# Patient Record
Sex: Male | Born: 2000 | Race: Black or African American | Hispanic: No | Marital: Single | Smoking: Current every day smoker
Health system: Southern US, Community
[De-identification: ages and names within clinical notes are randomized; demographics above are authoritative.]

## PROBLEM LIST (undated history)

## (undated) HISTORY — PX: EYE SURGERY: SHX253

---

## 2020-08-17 ENCOUNTER — Ambulatory Visit
Admission: RE | Admit: 2020-08-17 | Discharge: 2020-08-17 | Disposition: A | Discharge status: Home / Self Care | Payer: No Typology Code available for payment source | Source: Ambulatory Visit | Attending: *Deleted | Admitting: *Deleted

## 2020-08-17 ENCOUNTER — Other Ambulatory Visit: Payer: Self-pay | Admitting: *Deleted

## 2020-08-17 DIAGNOSIS — R042 Hemoptysis: Secondary | ICD-10-CM

## 2020-08-24 ENCOUNTER — Emergency Department (HOSPITAL_COMMUNITY)
Admission: EM | Admit: 2020-08-24 | Discharge: 2020-08-24 | Disposition: A | Discharge status: Home / Self Care | Payer: Managed Care, Other (non HMO) | Attending: Emergency Medicine | Admitting: Emergency Medicine

## 2020-08-24 ENCOUNTER — Encounter (HOSPITAL_COMMUNITY): Payer: Self-pay

## 2020-08-24 DIAGNOSIS — R45851 Suicidal ideations: Secondary | ICD-10-CM | POA: Diagnosis not present

## 2020-08-24 DIAGNOSIS — F6381 Intermittent explosive disorder: Secondary | ICD-10-CM | POA: Diagnosis present

## 2020-08-24 DIAGNOSIS — Z046 Encounter for general psychiatric examination, requested by authority: Secondary | ICD-10-CM

## 2020-08-24 DIAGNOSIS — F331 Major depressive disorder, recurrent, moderate: Secondary | ICD-10-CM | POA: Diagnosis not present

## 2020-08-24 DIAGNOSIS — Z20822 Contact with and (suspected) exposure to covid-19: Secondary | ICD-10-CM | POA: Diagnosis not present

## 2020-08-24 DIAGNOSIS — Y900 Blood alcohol level of less than 20 mg/100 ml: Secondary | ICD-10-CM | POA: Diagnosis not present

## 2020-08-24 DIAGNOSIS — R4689 Other symptoms and signs involving appearance and behavior: Secondary | ICD-10-CM | POA: Diagnosis present

## 2020-08-24 LAB — CBC WITH DIFFERENTIAL/PLATELET
Abs Immature Granulocytes: 0.01 10*3/uL (ref 0.00–0.07)
Basophils Absolute: 0 10*3/uL (ref 0.0–0.1)
Basophils Relative: 0 %
Eosinophils Absolute: 0.1 10*3/uL (ref 0.0–0.5)
Eosinophils Relative: 1 %
HCT: 43.5 % (ref 39.0–52.0)
Hemoglobin: 14.7 g/dL (ref 13.0–17.0)
Immature Granulocytes: 0 %
Lymphocytes Relative: 17 %
Lymphs Abs: 1.5 10*3/uL (ref 0.7–4.0)
MCH: 27.7 pg (ref 26.0–34.0)
MCHC: 33.8 g/dL (ref 30.0–36.0)
MCV: 81.9 fL (ref 80.0–100.0)
Monocytes Absolute: 0.7 10*3/uL (ref 0.1–1.0)
Monocytes Relative: 7 %
Neutro Abs: 6.5 10*3/uL (ref 1.7–7.7)
Neutrophils Relative %: 75 %
Platelets: 309 10*3/uL (ref 150–400)
RBC: 5.31 MIL/uL (ref 4.22–5.81)
RDW: 13.2 % (ref 11.5–15.5)
WBC: 8.7 10*3/uL (ref 4.0–10.5)
nRBC: 0 % (ref 0.0–0.2)

## 2020-08-24 LAB — COMPREHENSIVE METABOLIC PANEL
ALT: 24 U/L (ref 0–44)
AST: 37 U/L (ref 15–41)
Albumin: 5.2 g/dL — ABNORMAL HIGH (ref 3.5–5.0)
Alkaline Phosphatase: 53 U/L (ref 38–126)
Anion gap: 10 (ref 5–15)
BUN: 17 mg/dL (ref 6–20)
CO2: 25 mmol/L (ref 22–32)
Calcium: 10.1 mg/dL (ref 8.9–10.3)
Chloride: 104 mmol/L (ref 98–111)
Creatinine, Ser: 1.02 mg/dL (ref 0.61–1.24)
GFR, Estimated: >60 mL/min (ref >60)
Glucose, Bld: 98 mg/dL (ref 70–99)
Potassium: 3.9 mmol/L (ref 3.5–5.1)
Sodium: 139 mmol/L (ref 135–145)
Total Bilirubin: 0.7 mg/dL (ref 0.3–1.2)
Total Protein: 8.5 g/dL — ABNORMAL HIGH (ref 6.5–8.1)

## 2020-08-24 LAB — ETHANOL: Alcohol, Ethyl (B): <10 mg/dL (ref <10)

## 2020-08-24 LAB — SARS CORONAVIRUS 2 (TAT 6-24 HRS): SARS Coronavirus 2: NEGATIVE

## 2020-08-24 MED ORDER — ZOLPIDEM TARTRATE 5 MG PO TABS: 5.0000 mg | ORAL_TABLET | Freq: Every evening | ORAL | Status: DC | PRN

## 2020-08-24 MED ORDER — ALUM & MAG HYDROXIDE-SIMETH 200-200-20 MG/5ML PO SUSP: 30.0000 mL | Freq: Four times a day (QID) | ORAL | Status: DC | PRN

## 2020-08-24 MED ORDER — QUETIAPINE FUMARATE 100 MG PO TABS: 100.0000 mg | ORAL_TABLET | Freq: Every evening | ORAL | 1 refills | Status: AC | PRN

## 2020-08-24 MED ORDER — ACETAMINOPHEN 325 MG PO TABS: 650.0000 mg | ORAL_TABLET | ORAL | Status: DC | PRN

## 2020-08-24 MED ORDER — ONDANSETRON HCL 4 MG PO TABS: 4.0000 mg | ORAL_TABLET | Freq: Three times a day (TID) | ORAL | Status: DC | PRN

## 2020-08-24 MED ORDER — NICOTINE 21 MG/24HR TD PT24
21.0000 mg | MEDICATED_PATCH | Freq: Every day | TRANSDERMAL | Status: DC
  Administered 2020-08-24: 21 mg via TRANSDERMAL
  Filled 2020-08-24: qty 1

## 2020-08-24 MED ORDER — ARIPIPRAZOLE 5 MG PO TABS
5.0000 mg | ORAL_TABLET | Freq: Every day | ORAL | 1 refills | Status: AC
Start: 2020-08-24 — End: ?

## 2020-08-24 MED ORDER — SERTRALINE HCL 50 MG PO TABS: 50.0000 mg | ORAL_TABLET | Freq: Every day | ORAL | 1 refills | Status: AC
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
## 2020-08-24 NOTE — ED Notes (Signed)
Pt escorted to discharge window. Verbalized understanding discharge instructions, prescriptions, and follow-up. In no acute distress.
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
## 2020-08-24 NOTE — ED Provider Notes (Signed)
Patient cleared by behavioral health for discharge home.  Have provided 1 month supply of his medications.   Vanetta Mulders, MD 08/24/20 [phone]
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
## 2020-08-24 NOTE — Discharge Instructions (Addendum)
Helping Someone Who Is Suicidal Suicide is the act of ending (taking) one's own life. Someone who is thinking about suicide needs immediate help. Even if you do not know what to say or do to help, you can start by letting the person know that you care. Listen to him or her. Then talk about how to get help. Help is available through suicide hotlines, therapy, and other treatments. What are signs that someone is suicidal? Common signs include:  Signs of depression, such as: ? Tearfulness or sadness. ? Irritability or rage. ? Problems with eating or sleeping. ? Feeling guilty or worthless. ? Loss of interest in things that a person used to enjoy. ? Feelings of hopelessness or helplessness. ? Recurrent thoughts of death or suicide.  Changes in social behaviors and relationships, including: ? Isolating oneself. ? Withdrawing from friends and family. ? Giving away possessions. ? Saying goodbye. ? Acting aggressively. ? Sleeping more or less than usual. ? Having trouble managing school or work. ? Talking about feeling hopeless or being a burden. ? Engaging in risky behaviors, such as drinking more alcohol or using more drugs. What are the risk factors for suicide? Risk factors for suicide include:  Having a friend or family member who has died by suicide.  A history of attempted suicide.  Depression or other mental health problems.  Being exposed to graphic stories of suicide in the media.  Alcohol or drug abuse, especially when combined with a mental illness.  A serious physical problem, such as chronic pain.  A stressful life event, now or in the past, such as: ? Divorce or social rejection. ? Childhood abuse or neglect. ? Sudden life changes, such as financial crisis or going to jail. What should I do if someone is suicidal? If you think someone may be suicidal:  Ask him or her directly: "Are you thinking about suicide or hurting yourself?" Asking that question does not  make someone more likely to make a suicide attempt.  Avoid giving advice or arguing with the person about the value of his or her life. If a person confides in you that he or she is considering suicide:  Take the person seriously. Never ignore comments about suicide.  Listen to the person's thoughts and concerns with compassion.  Let the person know that you will stay with him or her.  Offer to help the person get to a doctor or mental health professional.  Remove all weapons and medicines from the person's living area.  Do not promise to keep his or her thoughts of suicide a secret.  Call a suicide crisis helpline, such as the National Suicide Prevention Lifeline at 475 154 8404 or text TALK to 434-120-3981.   Get help right away if: You believe that a person is in immediate danger of hurting himself or herself, or may have thoughts of taking his or her own life. You can:  Call a crisis center or a local suicide prevention center. These are often located at hospitals, clinics, [employer], social service providers, or health departments.  Call a suicide crisis helpline, such as the National Suicide Prevention Lifeline at 239-061-7683, or text TALK to 786-552-5385. This is open 24 hours a day.  Take the person to the nearest emergency department.  Call your local emergency services (911 in the [place]  [facility] [place] [address] health and human services helpline (211 in the [place] Summary  Suicide is the act of ending (taking) one's own life.  Suicide can be  prevented by knowing the signs and taking action.  If you know someone who is showing risk factors for suicide, ask if he or she is thinking about hurting himself or herself. Take all concerns about suicide seriously, and get support from experts in mental illness or suicide.  If you believe that a person is in immediate danger of hurting himself or herself, or may have thoughts of taking his or her own life, get help  right away. This information is not intended to replace advice given to you by your health care provider. Make sure you discuss any questions you have with your health care provider. Document Revised: 04/08/2020 Document Reviewed: 04/08/2020 Elsevier Patient Education  2021 Elsevier Inc.   Suicidal Feelings: How to Help Yourself Suicide is when you end your own life. There are many things you can do to help yourself feel better when struggling with these feelings. Many services and people are available to support you and others who struggle with similar feelings.  If you ever feel like you may hurt yourself or others, or have thoughts about taking your own life, get help right away. To get help:  Call your local emergency services (911 in the [place]  [facility] [place] health and human services helpline (211 in the [place]  Go to your nearest emergency department.  Call a suicide hotline to speak with a trained counselor. The following suicide hotlines are available in the [place]: ? 1-800-273-TALK 937-337-2932(1-331-159-2421). ? 1-800-SUICIDE (848)155-9601(1-303-825-7942). ? 609-685-36561-225-709-6914. This is a hotline for Spanish speakers. ? (651) 518-00721-205-874-7770. This is a hotline for TTY users. ? 1-866-4-U-TREVOR 309-362-1424(1-715-189-1128). This is a hotline for lesbian, gay, bisexual, transgender, or questioning youth. ? For a list of hotlines in [place], visit ItCheaper.dkwww.suicide.org/hotlines/international/[place]-suicide-hotlines.html  Contact a crisis center or a local suicide prevention center. To find a crisis center or suicide prevention center: ? Call your local hospital, clinic, community service organization, mental health center, social service provider, or health department. Ask for help with connecting to a crisis center. ? For a list of crisis centers in the [place], visit: suicidepreventionlifeline.org ? For a list of crisis centers in [place], visit: suicideprevention.ca How to help yourself feel better  Promise  yourself that you will not do anything extreme when you have suicidal feelings. Remember the times you have felt hopeful. Many people have gotten through suicidal thoughts and feelings, and you can too. If you have had these feelings before, remind yourself that you can get through them again.  Let family, friends, teachers, or counselors know how you are feeling. Try not to separate yourself from those who care about you and want to help you. Talk with someone every day, even if you do not feel sociable. Face-to-face conversation is best to help them understand your feelings.  Contact a mental health care provider and work with this person regularly.  Make a safety plan that you can follow during a crisis. Include phone numbers of suicide prevention hotlines, mental health professionals, and trusted friends and family members you can call during an emergency. Save these numbers on your phone.  If you are thinking of taking a lot of medicine, give your medicine to someone who can give it to you as prescribed. If you are on antidepressants and are concerned you will overdose, tell your health care provider so that he or she can give you safer medicines.  Try to stick to your routines and follow a schedule every day. Make self-care a priority.  Make a  list of realistic goals, and cross them off when you achieve them. Accomplishments can give you a sense of worth.  Wait until you are feeling better before doing things that you find difficult or unpleasant.  Do things that you have always enjoyed to take your mind off your feelings. Try reading a book, or listening to or playing music. Spending time outside, in nature, may help you feel better.   Follow these instructions at home:  Visit your primary health care provider every year for a checkup.  Work with a mental health care provider as needed.  Eat a well-balanced diet, and eat regular meals.  Get plenty of rest.  Exercise if you are able.  Just 30 minutes of exercise each day can help you feel better.  Take over-the-counter and prescription medicines only as told by your health care provider. Ask your mental health care provider about the possible side effects of any medicines you are taking.  Do not use alcohol or drugs, and remove these substances from your home.  Remove weapons, poisons, knives, and other deadly items from your home.   General recommendations  Keep your living space well lit.  When you are feeling well, write yourself a letter with tips and support that you can read when you are not feeling well.  Remember that life's difficulties can be sorted out with help. Conditions can be treated, and you can learn behaviors and ways of thinking that will help you. Where to find more information  National Suicide Prevention Lifeline: www.suicidepreventionlifeline.org  Hopeline: www.hopeline.com  [employer] for Suicide Prevention: https://www.ayers.com/  [employer] (for lesbian, gay, bisexual, transgender, or questioning youth): www.thetrevorproject.[employer] of Mental Health: http://www.wall.info/ Contact a health care provider if:  You feel as though you are a burden to others.  You feel agitated, angry, vengeful, or have extreme mood swings.  You have withdrawn from family and friends. Get help right away if:  You are talking about suicide or wishing to die.  You start making plans for how to commit suicide.  You feel that you have no reason to live.  You start making plans for putting your affairs in order, saying goodbye, or giving your possessions away.  You feel guilt, shame, or unbearable pain, and it seems like there is no way out.  You are frequently using drugs or alcohol.  You are engaging in risky behaviors that could lead to death. If you have any of these symptoms, get help right away. Call emergency services, go to your  nearest emergency department or crisis center, or call a suicide crisis helpline. Summary  Suicide is when you take your own life.  Promise yourself that you will not do anything extreme when you have suicidal feelings.  Let family, friends, teachers, or counselors know how you are feeling.  Get help right away if you start making plans for how to commit suicide. This information is not intended to replace advice given to you by your health care provider. Make sure you discuss any questions you have with your health care provider. Document Revised: 04/08/2020 Document Reviewed: 04/08/2020 Elsevier Patient Education  2021 [employer]. For your behavioral health needs, you are advised to follow up with the [facility] [place].  Contact them at your earliest opportunity to schedule an intake appointment:       [facility]. [address]  [place], [place] [address]      [phone]

## 2020-08-24 NOTE — ED Notes (Signed)
Pt remains very pleasant and cooperative.  Asking how much longer.  Pt informed it could be hours.

## 2020-08-24 NOTE — BH Assessment (Signed)
[facility] Assessment Progress Note  Per Shuvon Rankin, NP, this pt does not require psychiatric hospitalization at this time.  Pt presents under IVC initiated by Dr Elray Buba  which has been rescinded by EDP Vanetta Mulders, MD.  Pt is psychiatrically cleared.  Discharge instructions advise pt to follow up with the [facility].  Dr. Deretha Emory and pt's nurse, Lenis Dickinson, have been notified.  Doylene Canning, MA Triage Specialist [phone]

## 2020-08-24 NOTE — ED Notes (Signed)
2 pt belonging bags returned back to pt. Pt making a phone call now for transportation.

## 2020-08-24 NOTE — ED Notes (Signed)
Pt has 2 belonging bags on top shelf of cabinet in triage under ice machine

## 2020-08-25 ENCOUNTER — Telehealth (HOSPITAL_COMMUNITY): Payer: Self-pay | Admitting: Psychiatry

## 2022-09-13 IMAGING — CR DG CHEST 2V
2 series · 2 of 2 positions shown · non-contrast
Comparison: None.

CLINICAL DATA: Hemoptysis.

EXAM:
CHEST - 2 VIEW

[w chest pa]
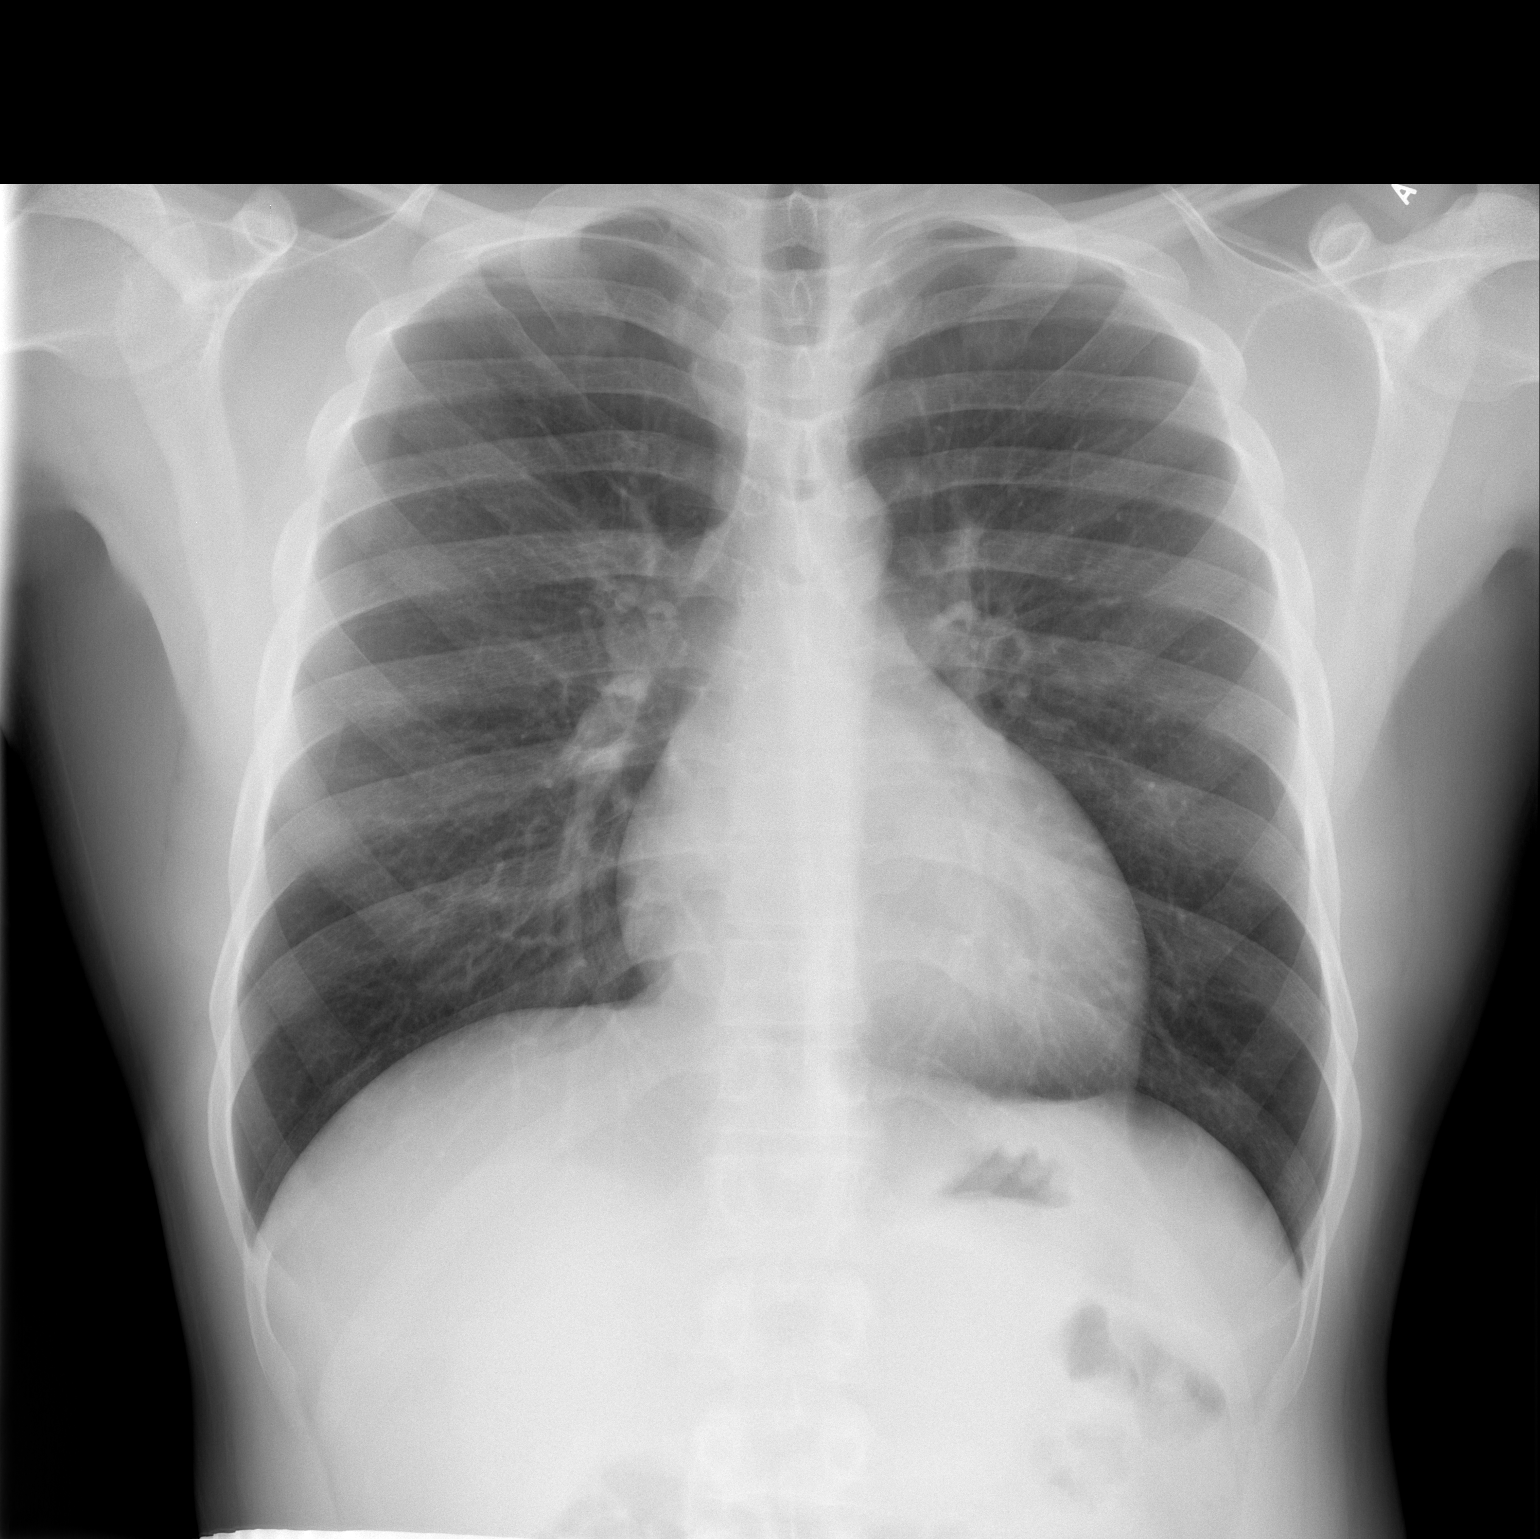

[w chest lat]
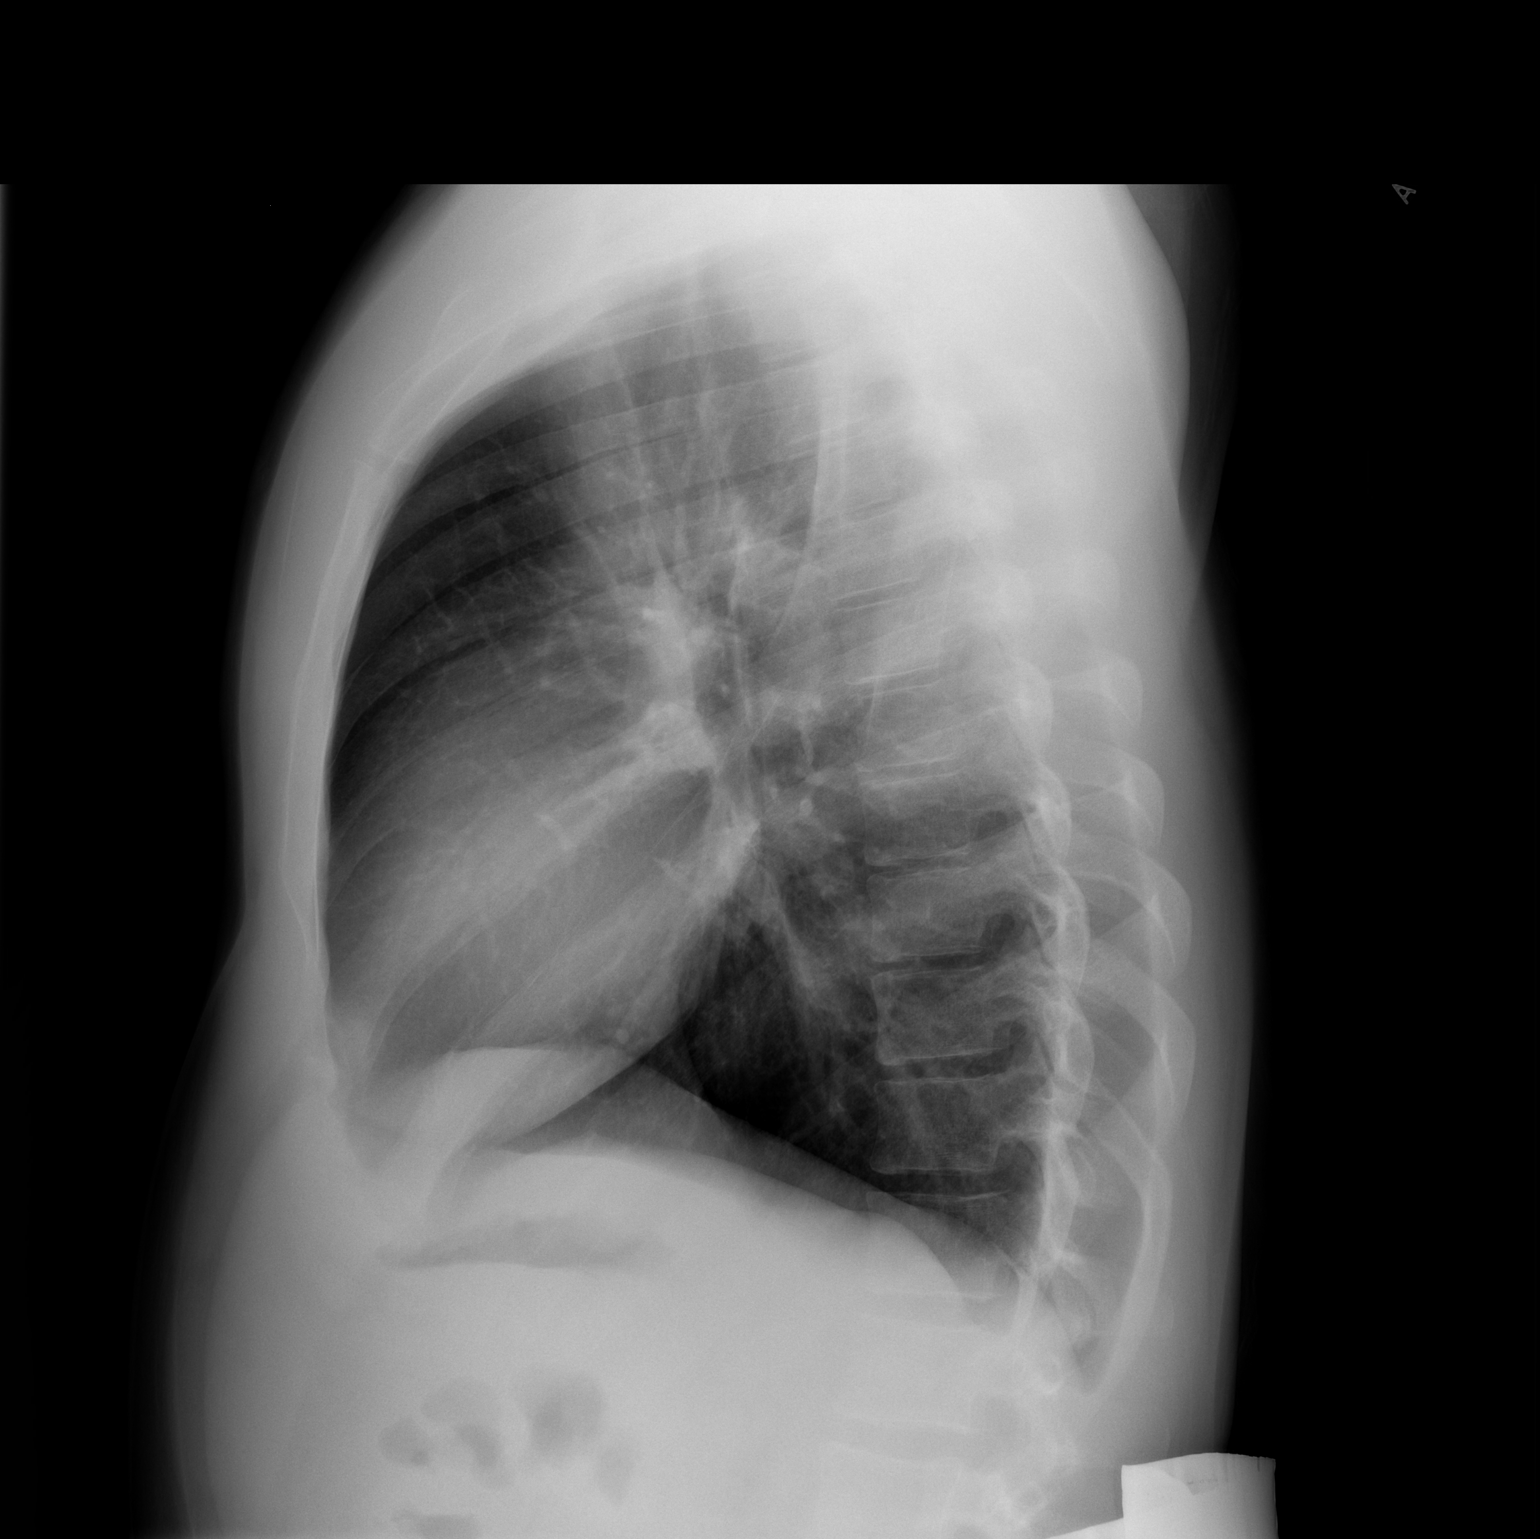

[2 of 2 positions shown; findings below may reference images not displayed]

FINDINGS: The heart size and mediastinal contours are within normal limits.
Both lungs are clear. The visualized skeletal structures are
unremarkable.
IMPRESSION: No active cardiopulmonary disease.

## 2023-08-29 DIAGNOSIS — Z01818 Encounter for other preprocedural examination: Secondary | ICD-10-CM | POA: Diagnosis not present

## 2024-01-16 DIAGNOSIS — Z419 Encounter for procedure for purposes other than remedying health state, unspecified: Secondary | ICD-10-CM | POA: Diagnosis not present

## 2024-02-15 DIAGNOSIS — Z419 Encounter for procedure for purposes other than remedying health state, unspecified: Secondary | ICD-10-CM | POA: Diagnosis not present

## 2024-03-17 DIAGNOSIS — Z419 Encounter for procedure for purposes other than remedying health state, unspecified: Secondary | ICD-10-CM | POA: Diagnosis not present

## 2024-04-17 DIAGNOSIS — Z419 Encounter for procedure for purposes other than remedying health state, unspecified: Secondary | ICD-10-CM | POA: Diagnosis not present

## 2024-06-17 DIAGNOSIS — Z419 Encounter for procedure for purposes other than remedying health state, unspecified: Secondary | ICD-10-CM | POA: Diagnosis not present

## 2024-06-22 ENCOUNTER — Ambulatory Visit (HOSPITAL_COMMUNITY)
Admission: EM | Admit: 2024-06-22 | Discharge: 2024-06-22 | Disposition: A | Discharge status: Home / Self Care | Payer: Self-pay

## 2024-06-22 NOTE — Progress Notes (Signed)
 Per triage, pt left AMA.

## 2024-06-23 ENCOUNTER — Ambulatory Visit (INDEPENDENT_AMBULATORY_CARE_PROVIDER_SITE_OTHER)

## 2024-06-23 DIAGNOSIS — F902 Attention-deficit hyperactivity disorder, combined type: Secondary | ICD-10-CM

## 2024-06-23 DIAGNOSIS — F1220 Cannabis dependence, uncomplicated: Secondary | ICD-10-CM

## 2024-06-23 DIAGNOSIS — F419 Anxiety disorder, unspecified: Secondary | ICD-10-CM

## 2024-06-23 DIAGNOSIS — F1994 Other psychoactive substance use, unspecified with psychoactive substance-induced mood disorder: Secondary | ICD-10-CM

## 2024-06-23 DIAGNOSIS — F1421 Cocaine dependence, in remission: Secondary | ICD-10-CM

## 2024-06-23 NOTE — Progress Notes (Signed)
 Comprehensive Clinical Assessment (CCA) Note  06/23/2024 Marc Byrd [id number]  Chief Complaint:  Chief Complaint  Patient presents with   Depression    anxiety   Visit Diagnosis: Cannabis use Disorder, Severe, Cocaine Use Disorder, severe, in sustained remission, Substance or medication induced depressive disorder, Unspecified Anxiety Disorder, ADHD, combined type   CCA Screening, Triage and Referral (STR)  Patient Reported Information How did you hear about us ? Other (Comment) (cam here in the past)  Referral name: came here in the past  Referral phone number: No data recorded  Whom do you see for routine medical problems? Primary Care  Practice/Facility Name: [facility]  Practice/Facility Phone Number: No data recorded Name of Contact: No data recorded Contact Number: No data recorded Contact Fax Number: No data recorded Prescriber Name: No data recorded Prescriber Address (if known): No data recorded  What Is the Reason for Your Visit/Call Today? Marc Byrd reports he has depression and anxiety and would like therapy and medication  How Long Has This Been Causing You Problems? > than 6 months  What Do You Feel Would Help You the Most Today? Treatment for Depression or other mood problem   Have You Recently Been in Any Inpatient Treatment (Hospital/Detox/Crisis Center/28-Day Program)? No  Name/Location of Program/Hospital:No data recorded How Long Were You There? No data recorded When Were You Discharged? No data recorded  Have You Ever Received Services From [facility] Before? Yes  Who Do You See at [facility]? wa seen at [facility], as was IVC'd from Marc Byrd   Have You Recently Had Any Thoughts About Hurting Yourself? No data recorded Are You Planning to Commit Suicide/Harm Yourself At This time? No data recorded  Have you Recently Had Thoughts About Hurting Someone Marc Byrd? No  Explanation: No data recorded  Have You Used Any  Alcohol or Drugs in the Past 24 Hours? Yes  How Long Ago Did You Use Drugs or Alcohol? No data recorded What Did You Use and How Much? beer, had 2 tall boys   Do You Currently Have a Therapist/Psychiatrist? No  Name of Therapist/Psychiatrist: No data recorded  Have You Been Recently Discharged From Any Office Practice or Programs? No  Explanation of Discharge From Practice/Program: No data recorded    CCA Screening Triage Referral Assessment Type of Contact: Face-to-Face  Is this Initial or Reassessment? No data recorded Date Telepsych consult ordered in [facility]:  No data recorded Time Telepsych consult ordered in [facility]:  No data recorded  Patient Reported Information Reviewed? No data recorded Patient Left Without Being Seen? No data recorded Reason for Not Completing Assessment: No data recorded  Collateral Involvement: none   Does Patient Have a Court Appointed Legal Guardian? No data recorded Name and Contact of Legal Guardian: No data recorded If Minor and Not Living with Parent(s), Who has Custody? No data recorded Is CPS involved or ever been involved? Never  Is APS involved or ever been involved? Never   Patient Determined To Be At Risk for Harm To Self or Others Based on Review of Patient Reported Information or Presenting Complaint? No  Method: No Plan  Availability of Means: No access or NA  Intent: Vague intent or NA  Notification Required: No need or identified person  Additional Information for Danger to Others Potential: No data recorded Additional Comments for Danger to Others Potential: No data recorded Are There Guns or Other Weapons in Your Home? Yes  Types of Guns/Weapons: thinks grandfather has a gun. not sure  what type. It is locked up.  Are These Weapons Safely Secured?                            Yes  Who Could Verify You Are Able To Have These Secured: No data recorded Do You Have any Outstanding Charges, Pending Court Dates, Parole/Probation?  Parole Until January 04, 2025  Contacted To Inform of Risk of Harm To Self or Others: No data recorded  Location of Assessment: [facility]   Does Patient Present under Involuntary Commitment? No  IVC Papers Initial File Date: No data recorded  [place] of Residence: [place]   Patient Currently Receiving the Following Services: No data recorded  Determination of Need: Routine (7 days)   Options For Referral: Chemical Dependency Intensive Outpatient Therapy (CDIOP); Outpatient Therapy; Medication Management     CCA Biopsychosocial Intake/Chief Complaint: Marc Byrd presents to the walk in clinic.  He reports that [facility] referred him.  Though he meets critieria for SAIOP, he has Bj's that this agency is out of network with.  Therapist explains she can refer him to another agency for [facility] and he says he wants to see how individual therapy will help.  He reports that he is on parole and recently was released from prison as he was in for 42 months for drug charges and armed robbery charges.   His GAD-7 score is 9 and his PHQ-9 is 7. He reports the onset of his depression and anxiety as a teenager when he got a job and realized how much money was withheld from his check. He says he got depressed and anxious because he realized that life was not as sweet as he was taught while growing up and now he has to face reality. Marc Byrd's substance use onset about the time he reports his depression and anxiety onset.  Marc Byrd reports he was dx with ADHD at a younger age and took Vyvanse. He continues to meet criteria for ADHD, combined type.    Current Symptoms/Problems: PhQ O is 9 and GAD 7 is 7   Patient Reported Schizophrenia/Schizoaffective Diagnosis in Past: No   Strengths: physically strong and healthy  Preferences: therapy and medication  Abilities: No data recorded  Type of Services Patient Feels are Needed: pt is unsure   Initial Clinical Notes/Concerns: No data  recorded  Mental Health Symptoms Depression:  -- (PHQ-9 is 9)   Duration of Depressive symptoms: No data recorded  Mania:  None   Anxiety:   -- (GAD7 is 7)   Psychosis:  None   Duration of Psychotic symptoms: No data recorded  Trauma:  None   Obsessions:  None   Compulsions:  No data recorded  Inattention:  Avoids/dislikes activities that require focus; Does not follow instructions (not oppositional); Does not seem to listen; Fails to pay attention/makes careless mistakes; Forgetful; Poor follow-through on tasks; Symptoms before age 83   Hyperactivity/Impulsivity:  Always on the go; Blurts out answers; Difficulty waiting turn; Feeling of restlessness; Fidgets with hands/feet; Symptoms present before age 20; Several symptoms present in 2 of more settings   Oppositional/Defiant Behaviors:  None   Emotional Irregularity:  None   Other Mood/Personality Symptoms:  No data recorded   Mental Status Exam Appearance and self-care  Stature:  Average   Weight:  Average weight   Clothing:  Casual   Grooming:  Normal   Cosmetic use:  None   Posture/gait:  Normal  Motor activity:  Not Remarkable   Sensorium  Attention:  Normal   Concentration:  Normal   Orientation:  X5   Recall/memory:  Normal   Affect and Mood  Affect:  No data recorded  Mood:  Anxious   Relating  Eye contact:  Normal   Facial expression:  Responsive   Attitude toward examiner:  Cooperative   Thought and Language  Speech flow: Clear and Coherent   Thought content:  Appropriate to Mood and Circumstances   Preoccupation:  None   Hallucinations:  None   Organization:  No data recorded  [employer] of Knowledge:  Average   Intelligence:  Average   Abstraction:  No data recorded  Judgement:  Fair   Dance Movement Psychotherapist:  Realistic   Insight:  Fair   Decision Making:  Normal   Social Functioning  Social Maturity:  Responsible   Social Judgement:  Chief Of Staff;  Normal   Stress  Stressors:  Family conflict; Legal; Financial   Coping Ability:  Exhausted   Skill Deficits:  None   Supports:  Family; Church; Friends/Service system     Religion: Religion/Spirituality Are You A Religious Person?: Yes How Might This Affect Treatment?: does not identify with any partiular religion  Leisure/Recreation: Leisure / Recreation Do You Have Hobbies?: No  Exercise/Diet: Exercise/Diet Do You Exercise?: Yes What Type of Exercise Do You Do?: Run/Walk How Many Times a Week Do You Exercise?: 6-7 times a week (runs 18 miles per day) Have You Gained or Lost A Significant Amount of Weight in the Past Six Months?: No Do You Follow a Special Diet?: No (every once in a while)   CCA Employment/Education Employment/Work Situation: Employment / Work Situation Employment Situation: Unemployed What is the Longest Time Patient has Held a Job?: 2 years 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 Has Patient ever Been in Equities Trader?: No  Education: Education Is Patient Currently Attending School?: No Last Grade Completed: 12 Name of High School: [employer] in [place] Did You Graduate From [employer]?: Yes Did Theme Park Manager?: No Did Designer, Television/film Set?: No Did You Have An Individualized Education Program (IIEP): No Did You Have Any Difficulty At [employer]?: Yes Were Any Medications Ever Prescribed For These Difficulties?: Yes Medications Prescribed For School Difficulties?: Vvaynse for ADHD Patient's Education Has Been Impacted by Current Illness: No   CCA Family/Childhood History Family and Relationship History:       CCA Substance Use Alcohol/Drug Use: Alcohol / Drug Use Pain Medications: denies Prescriptions: None Over the Counter: none History of alcohol / drug use?: Yes Longest period of sobriety (when/how long): 4 years (incarcerated) Negative Consequences of Use: Legal, Financial, Personal  relationships Substance #1 Name of Substance 1: Marijuna 1 - Age of First Use: 16 1 - Amount (size/oz): built up to 4 grams 1 - Frequency: daily 1 - Duration: from age 25 to one month ago, minus 4 years incarcerated 1 - Last Use / Amount: 1 month ago 1 - Method of Aquiring: illicit 1- Route of Use: smoking Substance #2 Name of Substance 2: alcohol 2 - Age of First Use: gave rum in baby bottle. In 7th grade he got a beer from the refrigerator.  Mother is from the [place]. 2 - Amount (size/oz): aobout 2 beers 2 - Frequency: 1-2x weekly 2 - Duration: since 2 - Last Use / Amount: last use.  2 tall boy beers 2 - Method of Aquiring: legal 2 -  Route of Substance Use: oral Substance #3 Name of Substance 3: cocaine Sept 9th, 2021 last use- went to jail 3 - Age of First Use: 34 3 - Amount (size/oz): built up to .2 or .3 grams 3 - Frequency: 2-3 x per week 3 - Duration: since age 54 3 - Last Use / Amount: 2020 prior to being locked up 3 - Method of Aquiring: illicit 3 - Route of Substance Use: inhale                   ASAM's:  Six Dimensions of Multidimensional Assessment  Dimension 1:  Acute Intoxication and/or Withdrawal Potential:   Dimension 1:  Description of individual's past and current experiences of substance use and withdrawal: no signs of withdrawal  Dimension 2:  Biomedical Conditions and Complications:   Dimension 2:  Description of patient's biomedical conditions and  complications: none  Dimension 3:  Emotional, Behavioral, or Cognitive Conditions and Complications:  Dimension 3:  Description of emotional, behavioral, or cognitive conditions and complications: depression and anxiety  Dimension 4:  Readiness to Change:  Dimension 4:  Description of Readiness to Change criteria: willing to enter tx  Dimension 5:  Relapse, Continued use, or Continued Problem Potential:  Dimension 5:  Relapse, continued use, or continued problem potential critiera description: little  recognition of relapse issues or coping skills o use  Dimension 6:  Recovery/Living Environment:  Dimension 6:  Recovery/Iiving environment criteria description: no addiction in home, but does not have sober support  ASAM Severity Score: ASAM's Severity Rating Score: 7  ASAM Recommended Level of Treatment: ASAM Recommended Level of Treatment: Level II Partial Hospitalization Treatment   Substance use Disorder (SUD) Substance Use Disorder (SUD)  Checklist Symptoms of Substance Use: Continued use despite having a persistent/recurrent physical/psychological problem caused/exacerbated by use, Continued use despite persistent or recurrent social, interpersonal problems, caused or exacerbated by use, Evidence of tolerance, Substance(s) often taken in larger amounts or over longer times than was intended, Social, occupational, recreational activities given up or reduced due to use, Large amounts of time spent to obtain, use or recover from the substance(s)  Recommendations for Services/Supports/Treatments: Recommendations for Services/Supports/Treatments Recommendations For Services/Supports/Treatments: Medication Management, IOP (Intensive Outpatient Program)  DSM5 Diagnoses: Patient Active Problem List   Diagnosis Date Noted   Aggressive behavior 08/24/2020   Involuntary commitment 08/24/2020   Suicidal ideation 08/24/2020   Intermittent explosive disorder 08/24/2020    Patient Centered Plan: Patient is on the following Treatment Plan(s):  Substance use. Due to time restrictions, therapist will complete his Care Plan when he returns for therapy on 07-21-24 at 2pm. He will walk in for Medication Management.   Referrals to Alternative Service(s): Referred to Alternative Service(s):   Place:   Date:   Time:    Referred to Alternative Service(s):   Place:   Date:   Time:    Referred to Alternative Service(s):   Place:   Date:   Time:    Referred to Alternative Service(s):   Place:   Date:    Time:      Collaboration of Care: n/a  Patient/Guardian was advised Release of Information must be obtained prior to any record release in order to collaborate their care with an outside provider. Patient/Guardian was advised if they have not already done so to contact the registration department to sign all necessary forms in order for us  to release information regarding their care.   Consent: Patient/Guardian gives verbal consent for treatment and assignment  of benefits for services provided during this visit. Patient/Guardian expressed understanding and agreed to proceed.   Darice Simpler, MS, LMFT, LCAS

## 2024-07-17 DIAGNOSIS — Z419 Encounter for procedure for purposes other than remedying health state, unspecified: Secondary | ICD-10-CM | POA: Diagnosis not present

## 2024-07-21 ENCOUNTER — Ambulatory Visit (HOSPITAL_COMMUNITY)
